# Patient Record
Sex: Male | Born: 1965 | Race: Black or African American | Hispanic: No | Marital: Single | Smoking: Never smoker
Health system: Southern US, Community
[De-identification: ages and names within clinical notes are randomized; demographics above are authoritative.]

## PROBLEM LIST (undated history)

## (undated) ENCOUNTER — Emergency Department: Admission: EM | Payer: Self-pay | Source: Home / Self Care

## (undated) DIAGNOSIS — I10 Essential (primary) hypertension: Secondary | ICD-10-CM

## (undated) DIAGNOSIS — E119 Type 2 diabetes mellitus without complications: Secondary | ICD-10-CM

## (undated) DIAGNOSIS — E785 Hyperlipidemia, unspecified: Secondary | ICD-10-CM

## (undated) HISTORY — DX: Essential (primary) hypertension: I10

## (undated) HISTORY — DX: Hyperlipidemia, unspecified: E78.5

## (undated) HISTORY — DX: Type 2 diabetes mellitus without complications: E11.9

---

## 2012-01-13 ENCOUNTER — Emergency Department: Payer: Self-pay | Admitting: Internal Medicine

## 2012-01-13 LAB — COMPREHENSIVE METABOLIC PANEL
Alkaline Phosphatase: 91 U/L (ref 50–136)
Anion Gap: 8 (ref 7–16)
BUN: 12 mg/dL (ref 7–18)
Bilirubin,Total: 0.8 mg/dL (ref 0.2–1.0)
Creatinine: 1.21 mg/dL (ref 0.60–1.30)
Osmolality: 282 (ref 275–301)
Potassium: 3.2 mmol/L — ABNORMAL LOW (ref 3.5–5.1)
SGPT (ALT): 32 U/L
Sodium: 141 mmol/L (ref 136–145)
Total Protein: 8.7 g/dL — ABNORMAL HIGH (ref 6.4–8.2)

## 2012-01-13 LAB — PROTIME-INR: INR: 1

## 2012-01-13 LAB — ETHANOL: Ethanol %: <0.003 % (ref 0.000–0.080)

## 2013-01-22 ENCOUNTER — Emergency Department: Payer: Self-pay | Admitting: Internal Medicine

## 2013-01-22 LAB — COMPREHENSIVE METABOLIC PANEL
Albumin: 3.7 g/dL (ref 3.4–5.0)
Alkaline Phosphatase: 105 U/L (ref 50–136)
Bilirubin,Total: 0.7 mg/dL (ref 0.2–1.0)
Calcium, Total: 8.9 mg/dL (ref 8.5–10.1)
Chloride: 99 mmol/L (ref 98–107)
Co2: 29 mmol/L (ref 21–32)
Creatinine: 1.51 mg/dL — ABNORMAL HIGH (ref 0.60–1.30)
EGFR (African American): >60
EGFR (Non-African Amer.): 55 — ABNORMAL LOW
Osmolality: 286 (ref 275–301)
Potassium: 4 mmol/L (ref 3.5–5.1)
SGOT(AST): 17 U/L (ref 15–37)
SGPT (ALT): 24 U/L (ref 12–78)
Total Protein: 8.4 g/dL — ABNORMAL HIGH (ref 6.4–8.2)

## 2013-01-22 LAB — CBC WITH DIFFERENTIAL/PLATELET
Basophil %: 1.1 %
Eosinophil #: 0.3 10*3/uL (ref 0.0–0.7)
Eosinophil %: 4.2 %
HCT: 34.5 % — ABNORMAL LOW (ref 40.0–52.0)
HGB: 11.7 g/dL — ABNORMAL LOW (ref 13.0–18.0)
Lymphocyte %: 23.5 %
MCH: 26.9 pg (ref 26.0–34.0)
MCHC: 33.8 g/dL (ref 32.0–36.0)
Monocyte #: 0.6 x10 3/mm (ref 0.2–1.0)
Monocyte %: 9.7 %
Neutrophil #: 4 10*3/uL (ref 1.4–6.5)
Platelet: 290 10*3/uL (ref 150–440)
RBC: 4.34 10*6/uL — ABNORMAL LOW (ref 4.40–5.90)

## 2013-01-22 LAB — URINALYSIS, COMPLETE
Bacteria: NONE SEEN
Bilirubin,UR: NEGATIVE
Leukocyte Esterase: NEGATIVE
Protein: NEGATIVE
Squamous Epithelial: NONE SEEN
WBC UR: 1 /HPF (ref 0–5)

## 2013-03-02 IMAGING — CR DG CHEST 1V PORT
1 series · 1 of 1 positions shown · non-contrast
Comparison: none

REASON FOR EXAM: shortness of breath
COMMENTS:

PROCEDURE:     DXR - DXR PORTABLE CHEST SINGLE VIEW  - January 13, 2012  [DATE]
RESULT:     Portable AP supine view of the chest was obtained. The lung
fields are clear. No pneumothorax is seen. The heart, mediastinal and
osseous structures show no significant abnormalities.

[portable]
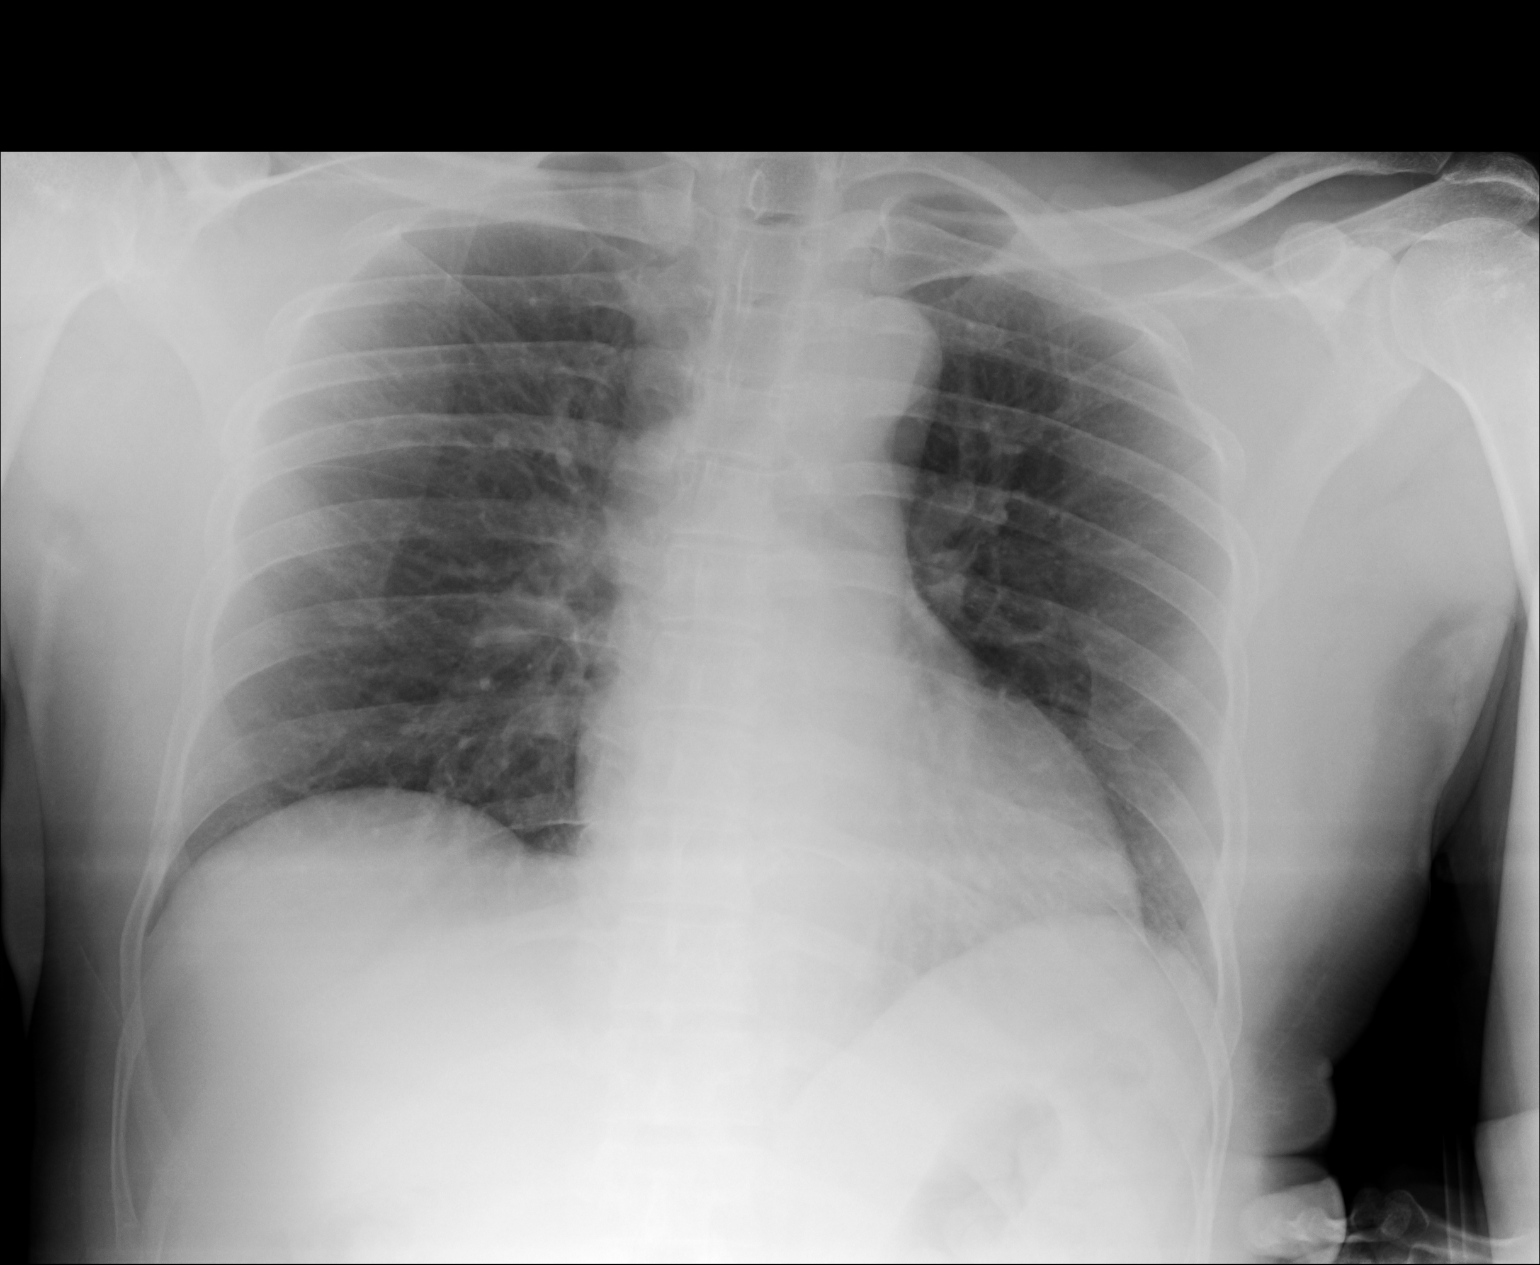

[1 of 1 positions shown; findings below may reference images not displayed]

IMPRESSION: 1.     No significant abnormalities are noted.

## 2017-08-31 ENCOUNTER — Other Ambulatory Visit: Payer: Self-pay

## 2017-08-31 DIAGNOSIS — Z5321 Procedure and treatment not carried out due to patient leaving prior to being seen by health care provider: Secondary | ICD-10-CM | POA: Insufficient documentation

## 2017-08-31 DIAGNOSIS — R441 Visual hallucinations: Secondary | ICD-10-CM | POA: Insufficient documentation

## 2017-08-31 LAB — CBC WITH DIFFERENTIAL/PLATELET
BASOS ABS: 0.1 10*3/uL (ref 0–0.1)
BASOS PCT: 1 %
EOS ABS: 0.1 10*3/uL (ref 0–0.7)
Eosinophils Relative: 0 %
HEMATOCRIT: 34.9 % — AB (ref 40.0–52.0)
HEMOGLOBIN: 11.6 g/dL — AB (ref 13.0–18.0)
Lymphocytes Relative: 33 %
Lymphs Abs: 4.7 10*3/uL — ABNORMAL HIGH (ref 1.0–3.6)
MCH: 30.3 pg (ref 26.0–34.0)
MCHC: 33.3 g/dL (ref 32.0–36.0)
MCV: 90.9 fL (ref 80.0–100.0)
Monocytes Absolute: 1.5 10*3/uL — ABNORMAL HIGH (ref 0.2–1.0)
Monocytes Relative: 10 %
NEUTROS ABS: 8 10*3/uL — AB (ref 1.4–6.5)
NEUTROS PCT: 56 %
Platelets: 326 10*3/uL (ref 150–440)
RBC: 3.84 MIL/uL — ABNORMAL LOW (ref 4.40–5.90)
RDW: 14.7 % — AB (ref 11.5–14.5)
WBC: 14.4 10*3/uL — AB (ref 3.8–10.6)

## 2017-08-31 NOTE — ED Triage Notes (Signed)
Pt presents w/ auditory and visual hallucinations w/ religious overtones. Pt states she can hear "jesus" talking to her now. Pt states "I am here because I did what Jesus told me to do." Pt is accompanied by [employer] officer,. [employer]  Took IVC papers out on pt.  Pt is in no acute distress at this time and is w/o physical health complaints. IVC papers state pt was walking naked in traffic. Pt states "running for her life" when asked why she was naked in traffic. Pt reports prior hx of schizophrenia and reports she did not take her meds last night. Pt states she has auditory hallucinations regardless of whether she is taking her meds or not.

## 2017-08-31 NOTE — ED Notes (Signed)
Pt dressed out into appropriate behavioral health clothing. Pt belongings consist of a gray t-shirt and a pink hair bow. Pt did not have any other belongings with her. Pt given a cup of water.

## 2017-09-01 LAB — GLUCOSE, CAPILLARY: GLUCOSE-CAPILLARY: 194 mg/dL — AB (ref 65–99)

## 2017-10-25 ENCOUNTER — Emergency Department
Admission: EM | Admit: 2017-10-25 | Discharge: 2017-10-25 | Disposition: A | Discharge status: Home / Self Care | Payer: Self-pay

## 2018-01-18 ENCOUNTER — Other Ambulatory Visit: Payer: Self-pay | Admitting: Family Medicine

## 2018-01-18 DIAGNOSIS — Z1239 Encounter for other screening for malignant neoplasm of breast: Secondary | ICD-10-CM

## 2020-01-05 ENCOUNTER — Ambulatory Visit: Payer: Self-pay | Attending: Internal Medicine

## 2020-01-05 DIAGNOSIS — Z23 Encounter for immunization: Secondary | ICD-10-CM

## 2020-01-05 NOTE — Progress Notes (Signed)
   Covid-19 Vaccination Clinic  Name:  Dwayne Wilkinson    MRN: [id number] DOB: 1965-11-14  01/05/2020  Mr. [name] was observed post Covid-19 immunization for 15 minutes without incident. He was provided with Vaccine Information Sheet and instruction to access the V-Safe system.   Mr. [name] was instructed to call 911 with any severe reactions post vaccine: Marland Kitchen Difficulty breathing  . Swelling of face and throat  . A fast heartbeat  . A bad rash all over body  . Dizziness and weakness   Immunizations Administered    Name Date Dose VIS Date Route   Pfizer COVID-19 Vaccine 01/05/2020  4:30 PM 0.3 mL 09/16/2019 Intramuscular   Manufacturer: [employer], [employer]   Lot: [id number]   NDC: [address]-4

## 2020-01-29 ENCOUNTER — Ambulatory Visit: Payer: Self-pay | Attending: Internal Medicine

## 2020-01-29 DIAGNOSIS — Z23 Encounter for immunization: Secondary | ICD-10-CM

## 2020-01-29 NOTE — Progress Notes (Signed)
   Covid-19 Vaccination Clinic  Name:  Dwayne Wilkinson    MRN: [id number] DOB: 05/01/1966  01/29/2020  [name] was observed post Covid-19 immunization for 15 minutes without incident. He was provided with Vaccine Information Sheet and instruction to access the V-Safe system.   [name] was instructed to call 911 with any severe reactions post vaccine: Marland Kitchen Difficulty breathing  . Swelling of face and throat  . A fast heartbeat  . A bad rash all over body  . Dizziness and weakness   Immunizations Administered    Name Date Dose VIS Date Route   Pfizer COVID-19 Vaccine 01/29/2020  3:49 PM 0.3 mL 11/30/2018 Intramuscular   Manufacturer: [employer], [employer]   Lot: [id number]   NDC: [address]-1

## 2022-03-17 ENCOUNTER — Ambulatory Visit: Payer: Self-pay

## 2022-03-25 ENCOUNTER — Encounter: Payer: Self-pay | Admitting: Nurse Practitioner

## 2022-03-25 ENCOUNTER — Ambulatory Visit: Payer: Self-pay | Admitting: Nurse Practitioner

## 2022-03-25 DIAGNOSIS — Z113 Encounter for screening for infections with a predominantly sexual mode of transmission: Secondary | ICD-10-CM

## 2022-03-25 DIAGNOSIS — Z202 Contact with and (suspected) exposure to infections with a predominantly sexual mode of transmission: Secondary | ICD-10-CM

## 2022-03-25 NOTE — Progress Notes (Signed)
[facility] Department STI clinic/screening visit  Subjective:  Dwayne Wilkinson is a 56 y.o. male being seen today for an STI screening visit. The patient reports they do not have symptoms.    Patient has the following medical conditions:  There are no problems to display for this patient.    Chief Complaint  Patient presents with   SEXUALLY TRANSMITTED DISEASE    Contact to Syphilis    HPI  Patient reports to clinic for treatment as a contact to Syphilis.  Patient's wife was recently treated for Syphilis.    Does the patient or their partner desires a pregnancy in the next year? No  Screening for MPX risk: Does the patient have an unexplained rash? No Is the patient MSM? No Does the patient endorse multiple sex partners or anonymous sex partners? No Did the patient have close or sexual contact with a person diagnosed with MPX? No Has the patient traveled outside the [place] where MPX is endemic? No Is there a high clinical suspicion for MPX-- evidenced by one of the following No  -Unlikely to be chickenpox  -Lymphadenopathy  -Rash that present in same phase of evolution on any given body part   See flowsheet for further details and programmatic requirements.   Immunization History  Administered Date(s) Administered   Hepatitis A, Adult 08/01/2015   Hepatitis B, adult 08/01/2015   PFIZER(Purple Top)SARS-COV-2 Vaccination 01/05/2020, 01/29/2020   Tdap 10/06/2012     The following portions of the patient's history were reviewed and updated as appropriate: allergies, current medications, past medical history, past social history, past surgical history and problem list.  Objective:  There were no vitals filed for this visit.  Physical Exam Constitutional:      Appearance: Normal appearance.  HENT:     Head: Normocephalic. No abrasion, masses or laceration. Hair is normal.     Comments: Folliculitis in the back of head, hair missing in the back of the  head. Eyes:     General: Lids are normal.        Right eye: No discharge.        Left eye: No discharge.     Conjunctiva/sclera: Conjunctivae normal.     Right eye: No exudate.    Left eye: No exudate. Pulmonary:     Effort: Pulmonary effort is normal.  Genitourinary:    Comments: Deferred, declined genital exam  Skin:    General: Skin is warm and dry.     Findings: No lesion or rash.  Neurological:     Mental Status: He is alert and oriented to person, place, and time.  Psychiatric:        Attention and Perception: Attention normal.        Mood and Affect: Mood normal.        Speech: Speech normal.        Behavior: Behavior is cooperative.       Assessment and Plan:  Dwayne Wilkinson is a 56 y.o. male presenting to the [facility] Department for STI screening  1. Screening examination for venereal disease -Patient only desires treatment for Syphilis.  Declines all STD screenings and blood work today.    2. Exposure to syphilis -Patient's spouse is in the process of being treated for Syphilis.  Patient would like to be treated as a contact today.  During interview patient states that he has an allergy to PCN.  Patient informed that treatment for Syphilis with patient who have a  PCN allergy is Doxycycline.  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  Telephone call to Dr. Lambert Mody regarding treatment.  Dr. Shellee Milo suggested continuation of current medication regimen and to ensure that patient continues with medication for an additional 28 days.  Current medication regimen appropriate for prophylactic treatment of Syphilis.  Also recommended that patient looks for warning signs of Syphilis.  Patient given signs and symptoms of Syphilis to monitor for.       Return if symptoms worsen or fail to improve.    Glenna Fellows, FNP
